# Patient Record
Sex: Male | Born: 1960 | Race: Black or African American | Hispanic: No
Health system: Southern US, Community
[De-identification: ages and names within clinical notes are randomized; demographics above are authoritative.]

## PROBLEM LIST (undated history)

## (undated) ENCOUNTER — Emergency Department (HOSPITAL_COMMUNITY): Admission: EM | Discharge status: Absent without leave | Payer: Self-pay

## (undated) DIAGNOSIS — I10 Essential (primary) hypertension: Secondary | ICD-10-CM

---

## 2004-03-23 ENCOUNTER — Emergency Department (HOSPITAL_COMMUNITY): Admission: EM | Admit: 2004-03-23 | Discharge: 2004-03-23 | Payer: Self-pay | Admitting: Emergency Medicine

## 2011-01-02 ENCOUNTER — Emergency Department (HOSPITAL_COMMUNITY): Payer: No Typology Code available for payment source

## 2011-01-02 ENCOUNTER — Inpatient Hospital Stay (HOSPITAL_COMMUNITY)
Admission: EM | Admit: 2011-01-02 | Discharge: 2011-01-05 | DRG: 086 | Disposition: A | Discharge status: Home / Self Care | Payer: No Typology Code available for payment source | Attending: Surgery | Admitting: Surgery

## 2011-01-02 ENCOUNTER — Inpatient Hospital Stay (HOSPITAL_COMMUNITY): Payer: No Typology Code available for payment source

## 2011-01-02 DIAGNOSIS — D62 Acute posthemorrhagic anemia: Secondary | ICD-10-CM | POA: Diagnosis present

## 2011-01-02 DIAGNOSIS — Y998 Other external cause status: Secondary | ICD-10-CM

## 2011-01-02 DIAGNOSIS — S0100XA Unspecified open wound of scalp, initial encounter: Secondary | ICD-10-CM | POA: Diagnosis present

## 2011-01-02 DIAGNOSIS — E119 Type 2 diabetes mellitus without complications: Secondary | ICD-10-CM | POA: Diagnosis present

## 2011-01-02 DIAGNOSIS — S42109A Fracture of unspecified part of scapula, unspecified shoulder, initial encounter for closed fracture: Secondary | ICD-10-CM | POA: Diagnosis present

## 2011-01-02 DIAGNOSIS — Y92410 Unspecified street and highway as the place of occurrence of the external cause: Secondary | ICD-10-CM

## 2011-01-02 DIAGNOSIS — IMO0002 Reserved for concepts with insufficient information to code with codable children: Secondary | ICD-10-CM

## 2011-01-02 DIAGNOSIS — I10 Essential (primary) hypertension: Secondary | ICD-10-CM | POA: Diagnosis present

## 2011-01-02 DIAGNOSIS — S5000XA Contusion of unspecified elbow, initial encounter: Secondary | ICD-10-CM | POA: Diagnosis present

## 2011-01-02 DIAGNOSIS — S8000XA Contusion of unspecified knee, initial encounter: Secondary | ICD-10-CM | POA: Diagnosis present

## 2011-01-02 DIAGNOSIS — S066X0A Traumatic subarachnoid hemorrhage without loss of consciousness, initial encounter: Principal | ICD-10-CM | POA: Diagnosis present

## 2011-01-02 DIAGNOSIS — S0180XA Unspecified open wound of other part of head, initial encounter: Secondary | ICD-10-CM | POA: Diagnosis present

## 2011-01-02 DIAGNOSIS — S2239XA Fracture of one rib, unspecified side, initial encounter for closed fracture: Secondary | ICD-10-CM | POA: Diagnosis present

## 2011-01-02 DIAGNOSIS — M6282 Rhabdomyolysis: Secondary | ICD-10-CM | POA: Diagnosis present

## 2011-01-02 LAB — POCT I-STAT, CHEM 8
Calcium, Ion: 1.05 mmol/L — ABNORMAL LOW (ref 1.12–1.32)
Potassium: 3.6 mEq/L (ref 3.5–5.1)
Sodium: 139 mEq/L (ref 135–145)

## 2011-01-02 LAB — CBC
HCT: 36.4 % — ABNORMAL LOW (ref 39.0–52.0)
Hemoglobin: 13.3 g/dL (ref 13.0–17.0)
MCH: 28.2 pg (ref 26.0–34.0)
MCHC: 36.5 g/dL — ABNORMAL HIGH (ref 30.0–36.0)
MCV: 77.3 fL — ABNORMAL LOW (ref 78.0–100.0)
Platelets: 208 10*3/uL (ref 150–400)

## 2011-01-02 LAB — COMPREHENSIVE METABOLIC PANEL
ALT: 69 U/L — ABNORMAL HIGH (ref 0–53)
AST: 87 U/L — ABNORMAL HIGH (ref 0–37)
Albumin: 3.9 g/dL (ref 3.5–5.2)
CO2: 19 mEq/L (ref 19–32)
Calcium: 9.6 mg/dL (ref 8.4–10.5)
GFR calc non Af Amer: 56 mL/min — ABNORMAL LOW (ref >60)
Potassium: 3.7 mEq/L (ref 3.5–5.1)
Total Bilirubin: 0.4 mg/dL (ref 0.3–1.2)
Total Protein: 7.9 g/dL (ref 6.0–8.3)

## 2011-01-02 LAB — PROTIME-INR
INR: 0.89 (ref 0.00–1.49)
Prothrombin Time: 12.2 seconds (ref 11.6–15.2)

## 2011-01-02 LAB — TYPE AND SCREEN

## 2011-01-02 LAB — GLUCOSE, CAPILLARY

## 2011-01-02 LAB — MRSA PCR SCREENING: MRSA by PCR: NEGATIVE

## 2011-01-02 MED ORDER — IOHEXOL 300 MG/ML  SOLN
100.0000 mL | Freq: Once | INTRAMUSCULAR | Status: AC | PRN
  Administered 2011-01-02: 100 mL via INTRAVENOUS

## 2011-01-03 ENCOUNTER — Inpatient Hospital Stay (HOSPITAL_COMMUNITY): Payer: No Typology Code available for payment source

## 2011-01-03 LAB — GLUCOSE, CAPILLARY
Glucose-Capillary: 188 mg/dL — ABNORMAL HIGH (ref 70–99)
Glucose-Capillary: 219 mg/dL — ABNORMAL HIGH (ref 70–99)
Glucose-Capillary: 227 mg/dL — ABNORMAL HIGH (ref 70–99)
Glucose-Capillary: 236 mg/dL — ABNORMAL HIGH (ref 70–99)
Glucose-Capillary: 276 mg/dL — ABNORMAL HIGH (ref 70–99)
Glucose-Capillary: 307 mg/dL — ABNORMAL HIGH (ref 70–99)

## 2011-01-03 LAB — CBC
MCV: 76.2 fL — ABNORMAL LOW (ref 78.0–100.0)
RBC: 4.49 MIL/uL (ref 4.22–5.81)
RDW: 13.3 % (ref 11.5–15.5)

## 2011-01-03 LAB — BASIC METABOLIC PANEL
BUN: 16 mg/dL (ref 6–23)
CO2: 26 mEq/L (ref 19–32)
Chloride: 103 mEq/L (ref 96–112)
GFR calc Af Amer: >60 mL/min (ref >60)
GFR calc non Af Amer: >60 mL/min (ref >60)
Glucose, Bld: 194 mg/dL — ABNORMAL HIGH (ref 70–99)
Potassium: 4 mEq/L (ref 3.5–5.1)
Sodium: 139 mEq/L (ref 135–145)

## 2011-01-03 LAB — CARDIAC PANEL(CRET KIN+CKTOT+MB+TROPI): CK, MB: 5.6 ng/mL — ABNORMAL HIGH (ref 0.3–4.0)

## 2011-01-04 ENCOUNTER — Inpatient Hospital Stay (HOSPITAL_COMMUNITY): Payer: No Typology Code available for payment source

## 2011-01-04 LAB — COMPREHENSIVE METABOLIC PANEL
ALT: 41 U/L (ref 0–53)
AST: 35 U/L (ref 0–37)
Alkaline Phosphatase: 78 U/L (ref 39–117)
CO2: 27 mEq/L (ref 19–32)
Calcium: 9.5 mg/dL (ref 8.4–10.5)
GFR calc Af Amer: >60 mL/min (ref >60)
GFR calc non Af Amer: >60 mL/min (ref >60)
Glucose, Bld: 233 mg/dL — ABNORMAL HIGH (ref 70–99)
Potassium: 3.5 mEq/L (ref 3.5–5.1)
Sodium: 133 mEq/L — ABNORMAL LOW (ref 135–145)

## 2011-01-04 LAB — GLUCOSE, CAPILLARY
Glucose-Capillary: 232 mg/dL — ABNORMAL HIGH (ref 70–99)
Glucose-Capillary: 193 mg/dL — ABNORMAL HIGH (ref 70–99)
Glucose-Capillary: 213 mg/dL — ABNORMAL HIGH (ref 70–99)

## 2011-01-04 LAB — CBC
HCT: 34.2 % — ABNORMAL LOW (ref 39.0–52.0)
Hemoglobin: 11.9 g/dL — ABNORMAL LOW (ref 13.0–17.0)
RBC: 4.48 MIL/uL (ref 4.22–5.81)
WBC: 14.7 10*3/uL — ABNORMAL HIGH (ref 4.0–10.5)

## 2011-01-05 LAB — CBC
Hemoglobin: 12.2 g/dL — ABNORMAL LOW (ref 13.0–17.0)
MCH: 26.2 pg (ref 26.0–34.0)
MCHC: 34.4 g/dL (ref 30.0–36.0)
Platelets: 211 10*3/uL (ref 150–400)
RBC: 4.65 MIL/uL (ref 4.22–5.81)

## 2011-01-05 LAB — GLUCOSE, CAPILLARY: Glucose-Capillary: 215 mg/dL — ABNORMAL HIGH (ref 70–99)

## 2011-01-05 LAB — BASIC METABOLIC PANEL
Calcium: 9.8 mg/dL (ref 8.4–10.5)
Creatinine, Ser: 0.71 mg/dL (ref 0.4–1.5)
GFR calc Af Amer: >60 mL/min (ref >60)
GFR calc non Af Amer: >60 mL/min (ref >60)
Sodium: 133 mEq/L — ABNORMAL LOW (ref 135–145)
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
## 2011-02-10 ENCOUNTER — Emergency Department (HOSPITAL_COMMUNITY)
Admission: EM | Admit: 2011-02-10 | Discharge: 2011-02-10 | Disposition: A | Discharge status: Home / Self Care | Payer: No Typology Code available for payment source | Attending: Emergency Medicine | Admitting: Emergency Medicine

## 2011-02-10 DIAGNOSIS — Z4802 Encounter for removal of sutures: Secondary | ICD-10-CM | POA: Insufficient documentation

## 2011-10-31 IMAGING — PX DG ORTHOPANTOGRAM /PANORAMIC
2 series · 2 of 2 positions shown · non-contrast
Comparison: Head CT obtained earlier today.  Cervical spine CT
obtained yesterday.

CLINICAL DATA: Diffuse mandibular pain following an injury.  Left
mandible lucency on a head CT obtained earlier today, suspicious
suspicious for a nondisplaced fracture.

DG ORTHOPANTOGRAM/PANORAMIC

[Series 1: — · U · 1 of 1 slices shown (1 of 2)]
[im 1/1]
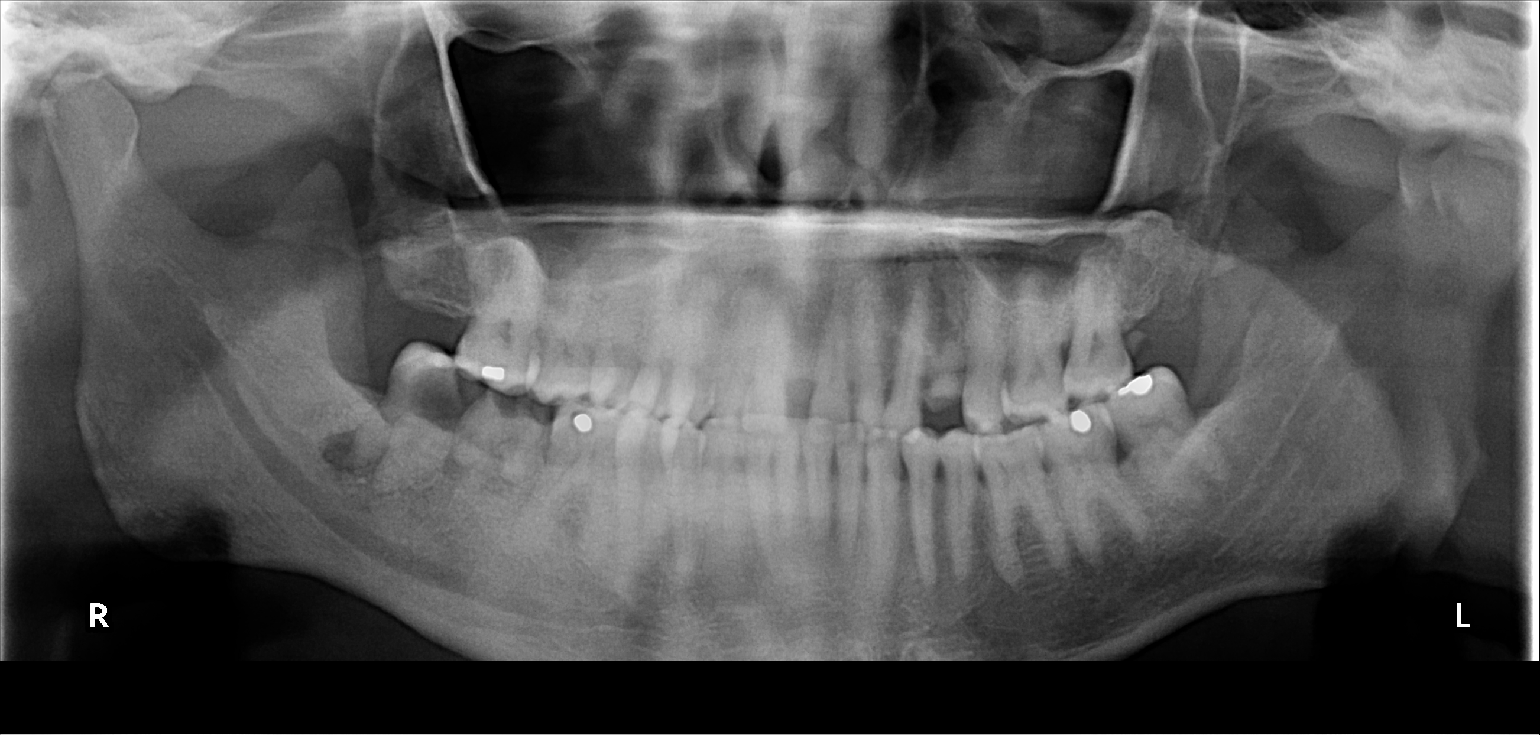

[Series 1: — · U · 1 of 1 slices shown (2 of 2)]
[im 1/1]
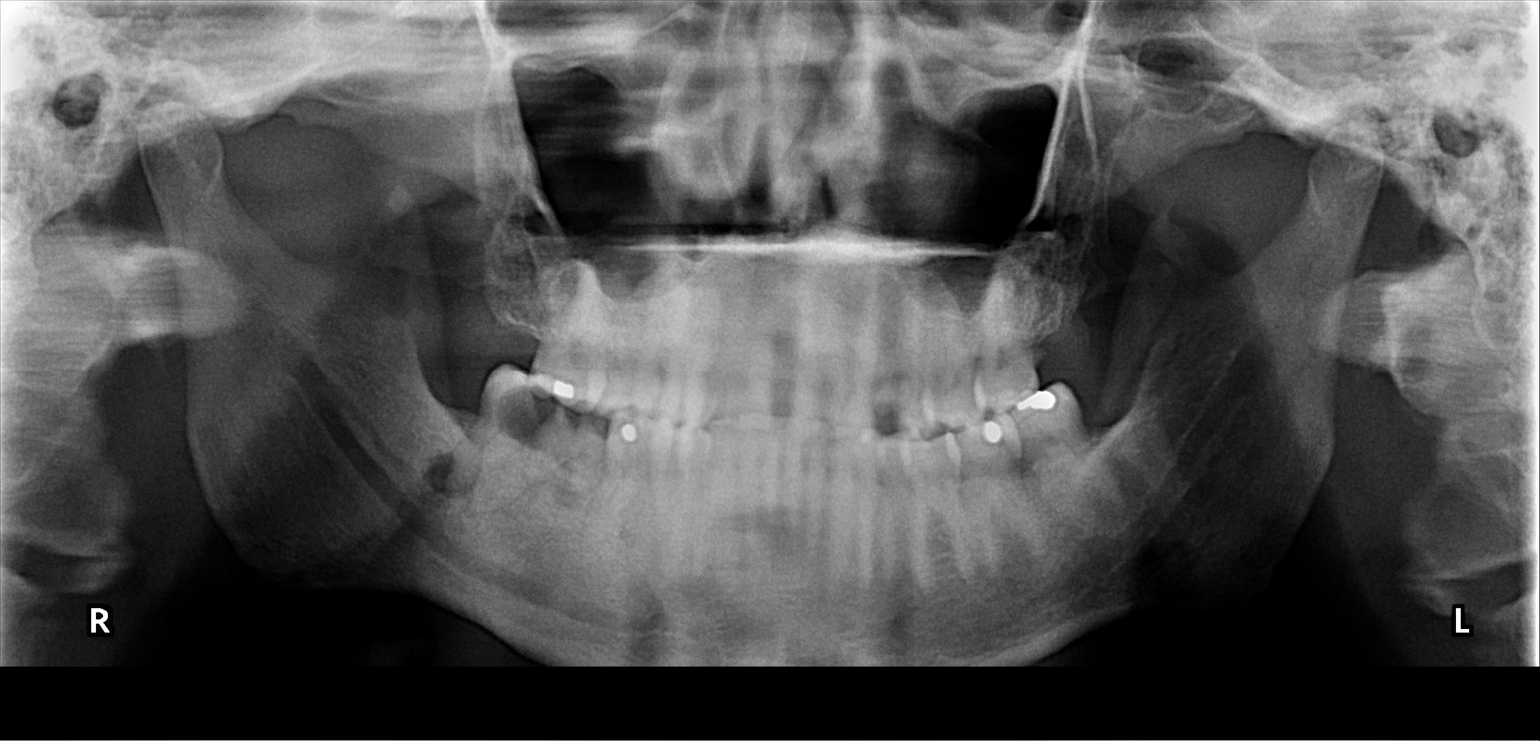

[2 of 2 positions shown; findings below may reference images not displayed]

FINDINGS: Large rounded lucency in the last remaining right lower
molar with lucency surrounding the posterior root of that tooth.
There is also a round lucency within or overlying the inferior
mandibular body on the left.  No fracture or dislocation is seen.
IMPRESSION: 1.  No fracture or dislocation visible.  A maxillofacial CT without
contrast would be more sensitive for confirming the nondisplaced
mandible fracture suspected on the head CT earlier today.
2.  Large right lower molar cavity and periapical abscess.
3.  Possible lytic lesion in the inferior left mandibular body.
This could be artifactual due to overlying tissues.  These
possibilities could be differentiated with a maxillofacial CT
without contrast.
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
## 2018-04-27 ENCOUNTER — Encounter (HOSPITAL_COMMUNITY): Payer: Self-pay | Admitting: Emergency Medicine

## 2018-04-27 ENCOUNTER — Emergency Department (HOSPITAL_COMMUNITY)
Admission: EM | Admit: 2018-04-27 | Discharge: 2018-04-27 | Discharge status: Against Medical Advice | Payer: Self-pay | Attending: Emergency Medicine | Admitting: Emergency Medicine

## 2018-04-27 DIAGNOSIS — M25562 Pain in left knee: Secondary | ICD-10-CM | POA: Insufficient documentation

## 2018-04-27 DIAGNOSIS — Z5321 Procedure and treatment not carried out due to patient leaving prior to being seen by health care provider: Secondary | ICD-10-CM | POA: Insufficient documentation

## 2018-04-27 HISTORY — DX: Essential (primary) hypertension: I10

## 2018-04-27 LAB — COMPREHENSIVE METABOLIC PANEL
ALBUMIN: 3.3 g/dL — AB (ref 3.5–5.0)
ALK PHOS: 82 U/L (ref 38–126)
ALT: 19 U/L (ref 0–44)
AST: 33 U/L (ref 15–41)
Anion gap: 12 (ref 5–15)
BILIRUBIN TOTAL: 0.8 mg/dL (ref 0.3–1.2)
BUN: 15 mg/dL (ref 6–20)
CO2: 25 mmol/L (ref 22–32)
CREATININE: 1.71 mg/dL — AB (ref 0.61–1.24)
Calcium: 9.6 mg/dL (ref 8.9–10.3)
Chloride: 101 mmol/L (ref 98–111)
GFR calc Af Amer: 50 mL/min — ABNORMAL LOW (ref >60)
GFR calc non Af Amer: 43 mL/min — ABNORMAL LOW (ref >60)
GLUCOSE: 346 mg/dL — AB (ref 70–99)
POTASSIUM: 4.6 mmol/L (ref 3.5–5.1)
Sodium: 138 mmol/L (ref 135–145)
Total Protein: 7.7 g/dL (ref 6.5–8.1)

## 2018-04-27 LAB — CBC
HCT: 44.1 % (ref 39.0–52.0)
HEMOGLOBIN: 14.4 g/dL (ref 13.0–17.0)
MCH: 26.3 pg (ref 26.0–34.0)
MCHC: 32.7 g/dL (ref 30.0–36.0)
MCV: 80.6 fL (ref 78.0–100.0)
Platelets: 239 10*3/uL (ref 150–400)
RBC: 5.47 MIL/uL (ref 4.22–5.81)
RDW: 13.2 % (ref 11.5–15.5)
WBC: 10.5 10*3/uL (ref 4.0–10.5)

## 2018-04-27 NOTE — ED Notes (Signed)
Called for vitals x2, no answer 

## 2018-04-27 NOTE — ED Triage Notes (Signed)
Pt reports left sided knee pain with no injury. Pt has a brace on and states is painful to bend knee.

## 2018-04-27 NOTE — ED Notes (Signed)
Pt here for knee pain but found to be extremely Hypertensive in triage pt reports being admitted for this in the past but does not currently take any medications for this.

## 2019-01-29 ENCOUNTER — Encounter (HOSPITAL_COMMUNITY): Payer: Self-pay | Admitting: *Deleted

## 2019-01-29 ENCOUNTER — Emergency Department (HOSPITAL_COMMUNITY)
Admission: EM | Admit: 2019-01-29 | Discharge: 2019-02-09 | Disposition: E | Payer: Self-pay | Attending: Emergency Medicine | Admitting: Emergency Medicine

## 2019-01-29 DIAGNOSIS — I469 Cardiac arrest, cause unspecified: Secondary | ICD-10-CM | POA: Insufficient documentation

## 2019-01-31 ENCOUNTER — Encounter (HOSPITAL_COMMUNITY): Payer: Self-pay | Admitting: Emergency Medicine

## 2019-02-09 NOTE — ED Notes (Signed)
Body to morgue.

## 2019-02-09 NOTE — Progress Notes (Signed)
   02/14/19 1900  Clinical Encounter Type  Visited With Family;Health care provider  Visit Type Initial;Psychological support;Spiritual support;Death;ED  Referral From Other (Comment) (ED bridge)  Spiritual Encounters  Spiritual Needs Brochure;Emotional;Grief support  Stress Factors  Family Stress Factors Exhausted;Family relationships;Financial concerns;Loss;Loss of control;Major life changes   Responded to ED pg for pt death.  Met w/ MD and family at bedside, family had just ID'd pt.  Pt's brother and his wife were present.  They had also admitted another family member to [facility] overnight.  Supported Occupational psychologist, listened to remembrances from the brothers' childhood.  Gave NOK info to RN Delmar Landau, provided belongings to family as corroberated by Mickle Plumb.  Gave pt placement card to family along w/ list of funeral homes.  Concerned about expense, pt not believed to have life insurance.    [place] Gianotti resident, [phone]

## 2019-02-09 NOTE — ED Notes (Signed)
Pt to ED via [facility] - found in parking lot, witnessed arrest with CPR initiated by bystanders. On EMS arrival, pt was asystolic, King airway placed, IO placed in right tibia--  Received EPi x 5 per EMS  700cc NS in prior to arrival.  Compressions continued with Harps--

## 2019-02-09 NOTE — ED Notes (Signed)
EPI x 1 given in IO

## 2019-02-09 NOTE — ED Triage Notes (Signed)
see code chart

## 2019-02-09 NOTE — ED Notes (Signed)
The pts brother Mike Powell was here  The pts clothes was cut off and soiled  Discarded by family request.  Keys  Money 35.00 and change also given to family and other clothes not soiled  Given to family

## 2019-02-09 NOTE — ED Provider Notes (Addendum)
[facility] EMERGENCY DEPARTMENT Provider Note   CSN: [id number] Arrival date & time: February 13, 2019  1433    History   Chief Complaint Chief Complaint  Patient presents with  . Cardiac Arrest    HPI Mike Powell is a 58 y.o. male.     HPI  Patient was witnessed by bystanders to be walking in a parking lot at an apartment complex.  He was seen to collapse and EMS was called.  No other additional history is known about the patient.  EMS arrival patient was asystole.  They initiated CPR with chest compressions and epinephrine.  King tube was placed.  He had PEA intermittently.  No return of pulses.  No past medical history on file.  There are no active problems to display for this patient.         Home Medications    Prior to Admission medications   Not on File    Family History No family history on file.  Social History Social History   Tobacco Use  . Smoking status: Not on file  Substance Use Topics  . Alcohol use: Not on file  . Drug use: Not on file     Allergies   Patient has no allergy information on record.   Review of Systems Review of Systems Level 5 caveat cannot obtain review of systems patient condition  Physical Exam Updated Vital Signs Ht 5\' 10"  (1.778 m)   Wt 90.7 kg   BMI 28.70 kg/m   Physical Exam Constitutional:      Comments: Unresponsive with [name] giving chest compressions.  HENT:     Head: Normocephalic and atraumatic.     Mouth/Throat:     Comments: Intubated with a King tube.  Some blood around the nares Cardiovascular:     Comments: No palpable pulses without active chest compressions.  Ultrasound used and no cardiac activity. Pulmonary:     Comments: Good chest rise and fall and breath sounds with bagging. Abdominal:     Comments: Abdomen is mildly distended and soft.  Musculoskeletal:     Comments: There bandaged dressings over both lower extremities over edema.  They appear moist.  Skin:   General: Skin is warm and dry.  Neurological:     Comments: Completely unresponsive.      ED Treatments / Results  Labs (all labs ordered are listed, but only abnormal results are displayed) Labs Reviewed - No data to display  EKG None  Radiology No results found.  Procedures Procedures (including critical care time)  Medications Ordered in ED Medications - No data to display   Initial Impression / Assessment and Plan / ED Course  I have reviewed the triage vital signs and the nursing notes.  Pertinent labs & imaging results that were available during my care of the patient were reviewed by me and considered in my medical decision making (see chart for details).       Patient arrives by EMS with 30 minutes of PEA and asystole prior to arrival.  Had multiple rounds of epinephrine without return of pulses.  1 additional epinephrine and chest compressions administered and then pulse check.  No pulses with PEA.  Sound used to evaluate for cardiac activity.  None present.  CPR discontinued.  This time, very little is known about the patient.  Patient's nurse advises me that they determined who the patient was in his brother is being contacted to inform identity as Mike Powell.  Family has positively  identified the patient is Mike Powell.  Family has been updated on events surrounding his death.  Consult:Tracy Zema medical examiner. Requests call back when more info, at this time doesn't sound like ME case based on known information   Final Clinical Impressions(s) / ED Diagnoses   Final diagnoses:  Cardiopulmonary arrest [facility](HCC)    ED Discharge Orders    None       Arby BarrettePfeiffer, Nivan Melendrez, MD 2018-09-30 1535    Arby BarrettePfeiffer, Aidon Klemens, MD 2018-09-30 1557    Arby BarrettePfeiffer, Wilhemenia Camba, MD 2018-09-30 1614

## 2019-02-09 DEATH — deceased

## 2019-09-12 DEATH — deceased
